# Patient Record
Sex: Female | Born: 1980 | Race: White | Hispanic: No | Marital: Married | Smoking: Never smoker
Health system: Southern US, Community
[De-identification: ages and names within clinical notes are randomized; demographics above are authoritative.]

## PROBLEM LIST (undated history)

## (undated) DIAGNOSIS — F419 Anxiety disorder, unspecified: Secondary | ICD-10-CM

## (undated) HISTORY — PX: GALLBLADDER SURGERY: SHX652

## (undated) HISTORY — PX: CHOLECYSTECTOMY: SHX55

## (undated) HISTORY — PX: TUBAL LIGATION: SHX77

---

## 2012-11-16 ENCOUNTER — Emergency Department (HOSPITAL_COMMUNITY)
Admission: EM | Admit: 2012-11-16 | Discharge: 2012-11-16 | Disposition: A | Discharge status: Home / Self Care | Payer: Self-pay | Attending: Emergency Medicine | Admitting: Emergency Medicine

## 2012-11-16 ENCOUNTER — Encounter (HOSPITAL_COMMUNITY): Payer: Self-pay | Admitting: Emergency Medicine

## 2012-11-16 DIAGNOSIS — K029 Dental caries, unspecified: Secondary | ICD-10-CM | POA: Insufficient documentation

## 2012-11-16 DIAGNOSIS — F411 Generalized anxiety disorder: Secondary | ICD-10-CM | POA: Insufficient documentation

## 2012-11-16 DIAGNOSIS — K0889 Other specified disorders of teeth and supporting structures: Secondary | ICD-10-CM

## 2012-11-16 HISTORY — DX: Anxiety disorder, unspecified: F41.9

## 2012-11-16 MED ORDER — HYDROCODONE-ACETAMINOPHEN 5-325 MG PO TABS: ORAL_TABLET | ORAL | Status: DC

## 2012-11-16 MED ORDER — AMOXICILLIN 250 MG PO CAPS
500.0000 mg | ORAL_CAPSULE | Freq: Once | ORAL | Status: AC
  Administered 2012-11-16: 500 mg via ORAL
  Filled 2012-11-16: qty 2

## 2012-11-16 MED ORDER — AMOXICILLIN 500 MG PO CAPS: 500.0000 mg | ORAL_CAPSULE | Freq: Three times a day (TID) | ORAL | Status: DC

## 2012-11-16 MED ORDER — HYDROCODONE-ACETAMINOPHEN 5-325 MG PO TABS
1.0000 | ORAL_TABLET | Freq: Once | ORAL | Status: AC
  Administered 2012-11-16: 1 via ORAL
  Filled 2012-11-16: qty 1

## 2012-11-16 NOTE — ED Provider Notes (Signed)
History     CSN: [id number]  Arrival date & time 11/16/12  0930   First MD Initiated Contact with Patient 11/16/12 1002      Chief Complaint  Patient presents with  . Dental Pain    (Consider location/radiation/quality/duration/timing/severity/associated sxs/prior treatment) Patient is a 32 y.o. female presenting with tooth pain. The history is provided by the patient.  Dental PainThe primary symptoms include mouth pain. Primary symptoms do not include dental injury, oral bleeding, headaches, fever, shortness of breath, sore throat, angioedema or cough. The symptoms began more than 1 week ago. The symptoms are waxing and waning. The symptoms are recurrent. The symptoms occur constantly.  Mouth pain occurs frequently. Mouth pain is worsening. Affected locations include: teeth and gum(s).  Additional symptoms include: dental sensitivity to temperature, gum tenderness and ear pain. Additional symptoms do not include: gum swelling, trismus, jaw pain, facial swelling, trouble swallowing, pain with swallowing and swollen glands. Medical issues include: periodontal disease. Medical issues do not include: smoking.    Past Medical History  Diagnosis Date  . Anxiety     Past Surgical History  Procedure Date  . Cholecystectomy   . Tubal ligation     Family History  Problem Relation Age of Onset  . Cancer Other   . Diabetes Other     History  Substance Use Topics  . Smoking status: Never Smoker   . Smokeless tobacco: Never Used  . Alcohol Use: No    OB History    Grav Para Term Preterm Abortions TAB SAB Ect Mult Living   1 1 1       1       Review of Systems  Constitutional: Negative for fever and appetite change.  HENT: Positive for ear pain and dental problem. Negative for congestion, sore throat, facial swelling, trouble swallowing, neck pain and neck stiffness.   Eyes: Negative for pain and visual disturbance.  Respiratory: Negative for cough and shortness of breath.     Neurological: Negative for dizziness, facial asymmetry and headaches.  Hematological: Negative for adenopathy.  All other systems reviewed and are negative.    Allergies  Review of patient's allergies indicates no known allergies.  Home Medications   Current Outpatient Rx  Name  Route  Sig  Dispense  Refill  . DIPHENHYDRAMINE-APAP (SLEEP) 25-500 MG PO TABS   Oral   Take 2 tablets by mouth at bedtime as needed. For pain         . IBUPROFEN 200 MG PO TABS   Oral   Take 800 mg by mouth every 6 (six) hours as needed.           BP 129/69  Pulse 79  Temp 97.9 F (36.6 C) (Oral)  Resp 18  Ht 5\' 7"  (1.702 m)  Wt 202 lb (91.627 kg)  BMI 31.64 kg/m2  SpO2 100%  LMP 11/12/2012  Physical Exam  Nursing note and vitals reviewed. Constitutional: She is oriented to person, place, and time. She appears well-developed and well-nourished. No distress.  HENT:  Head: Normocephalic and atraumatic. No trismus in the jaw.  Right Ear: Tympanic membrane and ear canal normal.  Left Ear: Tympanic membrane and ear canal normal.  Mouth/Throat: Uvula is midline, oropharynx is clear and moist and mucous membranes are normal. Dental caries present. No dental abscesses or uvula swelling.         Dental decay of the left lower first molar.  No dental abscess, trismus, or facial swelling  Neck: Normal  range of motion. Neck supple.  Cardiovascular: Normal rate, regular rhythm and normal heart sounds.   No murmur heard. Pulmonary/Chest: Effort normal and breath sounds normal.  Musculoskeletal: Normal range of motion.  Lymphadenopathy:    She has no cervical adenopathy.  Neurological: She is alert and oriented to person, place, and time. She exhibits normal muscle tone. Coordination normal.  Skin: Skin is warm and dry.    ED Course  Procedures (including critical care time)  Labs Reviewed - No data to display No results found.     MDM     ttp of the left lower first molar.  No  facial edema, trismus or obvious dental abscess.  Has appt with a dentist next Thursday.     Prescribed: Amoxil norco #20  Khristian Phillippi L. Harvey, [place] 11/17/12 2107

## 2012-11-16 NOTE — ED Notes (Signed)
Patient left lower dental pain that radiates up jaw and into ear. Patient reports taking motrin and tylenol with no relief.

## 2012-11-17 NOTE — ED Provider Notes (Signed)
Medical screening examination/treatment/procedure(s) were performed by non-physician practitioner and as supervising physician I was immediately available for consultation/collaboration.   Benny Lennert, MD 11/17/12 2111

## 2014-08-15 ENCOUNTER — Encounter (HOSPITAL_COMMUNITY): Payer: Self-pay | Admitting: Emergency Medicine

## 2015-04-21 ENCOUNTER — Emergency Department (HOSPITAL_COMMUNITY)
Admission: EM | Admit: 2015-04-21 | Discharge: 2015-04-21 | Disposition: A | Discharge status: Home / Self Care | Payer: Self-pay | Attending: Emergency Medicine | Admitting: Emergency Medicine

## 2015-04-21 ENCOUNTER — Emergency Department (HOSPITAL_COMMUNITY): Payer: Self-pay

## 2015-04-21 ENCOUNTER — Encounter (HOSPITAL_COMMUNITY): Payer: Self-pay | Admitting: Emergency Medicine

## 2015-04-21 DIAGNOSIS — Z8659 Personal history of other mental and behavioral disorders: Secondary | ICD-10-CM | POA: Insufficient documentation

## 2015-04-21 DIAGNOSIS — Y9389 Activity, other specified: Secondary | ICD-10-CM | POA: Insufficient documentation

## 2015-04-21 DIAGNOSIS — Y9289 Other specified places as the place of occurrence of the external cause: Secondary | ICD-10-CM | POA: Insufficient documentation

## 2015-04-21 DIAGNOSIS — Y998 Other external cause status: Secondary | ICD-10-CM | POA: Insufficient documentation

## 2015-04-21 DIAGNOSIS — S4992XA Unspecified injury of left shoulder and upper arm, initial encounter: Secondary | ICD-10-CM | POA: Insufficient documentation

## 2015-04-21 MED ORDER — IBUPROFEN 800 MG PO TABS: 800.0000 mg | ORAL_TABLET | Freq: Three times a day (TID) | ORAL | Status: AC

## 2015-04-21 MED ORDER — HYDROCODONE-ACETAMINOPHEN 5-325 MG PO TABS: ORAL_TABLET | ORAL | Status: DC

## 2015-04-21 NOTE — Discharge Instructions (Signed)
Shoulder Sprain °A shoulder sprain is the result of damage to the tough, fiber-like tissues (ligaments) that help hold your shoulder in place. The ligaments may be stretched or torn. Besides the main shoulder joint (the ball and socket), there are several smaller joints that connect the bones in this area. A sprain usually involves one of those joints. Most often it is the acromioclavicular (or AC) joint. That is the joint that connects the collarbone (clavicle) and the shoulder blade (scapula) at the top point of the shoulder blade (acromion). °A shoulder sprain is a mild form of what is called a shoulder separation. Recovering from a shoulder sprain may take some time. For some, pain lingers for several months. Most people recover without long term problems. °CAUSES  °· A shoulder sprain is usually caused by some kind of trauma. This might be: °¨ Falling on an outstretched arm. °¨ Being hit hard on the shoulder. °¨ Twisting the arm. °· Shoulder sprains are more likely to occur in people who: °¨ Play sports. °¨ Have balance or coordination problems. °SYMPTOMS  °· Pain when you move your shoulder. °· Limited ability to move the shoulder. °· Swelling and tenderness on top of the shoulder. °· Redness or warmth in the shoulder. °· Bruising. °· A change in the shape of the shoulder. °DIAGNOSIS  °Your healthcare provider may: °· Ask about your symptoms. °· Ask about recent activity that might have caused those symptoms. °· Examine your shoulder. You may be asked to do simple exercises to test movement. The other shoulder will be examined for comparison. °· Order some tests that provide a look inside the body. They can show the extent of the injury. The tests could include: °¨ X-rays. °¨ CT (computed tomography) scan. °¨ MRI (magnetic resonance imaging) scan. °RISKS AND COMPLICATIONS °· Loss of full shoulder motion. °· Ongoing shoulder pain. °TREATMENT  °How long it takes to recover from a shoulder sprain depends on how  severe it was. Treatment options may include: °· Rest. You should not use the arm or shoulder until it heals. °· Ice. For 2 or 3 days after the injury, put an ice pack on the shoulder up to 4 times a day. It should stay on for 15 to 20 minutes each time. Wrap the ice in a towel so it does not touch your skin. °· Over-the-counter medicine to relieve pain. °· A sling or brace. This will keep the arm still while the shoulder is healing. °· Physical therapy or rehabilitation exercises. These will help you regain strength and motion. Ask your healthcare provider when it is OK to begin these exercises. °· Surgery. The need for surgery is rare with a sprained shoulder, but some people may need surgery to keep the joint in place and reduce pain. °HOME CARE INSTRUCTIONS  °· Ask your healthcare provider about what you should and should not do while your shoulder heals. °· Make sure you know how to apply ice to the correct area of your shoulder. °· Talk with your healthcare provider about which medications should be used for pain and swelling. °· If rehabilitation therapy will be needed, ask your healthcare provider to refer you to a therapist. If it is not recommended, then ask about at-home exercises. Find out when exercise should begin. °SEEK MEDICAL CARE IF:  °Your pain, swelling, or redness at the joint increases. °SEEK IMMEDIATE MEDICAL CARE IF:  °· You have a fever. °· You cannot move your arm or shoulder. °Document Released: 02/16/2009 Document   Revised: 12/23/2011 Document Reviewed: 02/16/2009 °[employer]® Patient Information ©2015 [employer]. This information is not intended to replace advice given to you by your health care provider. Make sure you discuss any questions you have with your health care provider. ° °
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
## 2015-06-14 ENCOUNTER — Emergency Department (HOSPITAL_COMMUNITY): Payer: Medicaid Other

## 2015-06-14 ENCOUNTER — Emergency Department (HOSPITAL_COMMUNITY)
Admission: EM | Admit: 2015-06-14 | Discharge: 2015-06-14 | Disposition: A | Discharge status: Home / Self Care | Payer: Medicaid Other | Attending: Emergency Medicine | Admitting: Emergency Medicine

## 2015-06-14 ENCOUNTER — Encounter (HOSPITAL_COMMUNITY): Payer: Self-pay | Admitting: *Deleted

## 2015-06-14 DIAGNOSIS — Z8659 Personal history of other mental and behavioral disorders: Secondary | ICD-10-CM | POA: Insufficient documentation

## 2015-06-14 DIAGNOSIS — Z791 Long term (current) use of non-steroidal anti-inflammatories (NSAID): Secondary | ICD-10-CM | POA: Diagnosis not present

## 2015-06-14 DIAGNOSIS — Z792 Long term (current) use of antibiotics: Secondary | ICD-10-CM | POA: Diagnosis not present

## 2015-06-14 DIAGNOSIS — M25511 Pain in right shoulder: Secondary | ICD-10-CM | POA: Diagnosis present

## 2015-06-14 MED ORDER — KETOROLAC TROMETHAMINE 60 MG/2ML IM SOLN
60.0000 mg | Freq: Once | INTRAMUSCULAR | Status: AC
  Administered 2015-06-14: 60 mg via INTRAMUSCULAR
  Filled 2015-06-14: qty 2

## 2015-06-14 MED ORDER — PROMETHAZINE HCL 25 MG/ML IJ SOLN
25.0000 mg | Freq: Once | INTRAMUSCULAR | Status: AC
Start: 2015-06-14 — End: 2015-06-14
  Administered 2015-06-14: 25 mg via INTRAMUSCULAR
  Filled 2015-06-14: qty 1
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
## 2015-06-14 NOTE — Discharge Instructions (Signed)
Ibuprofen 600 mg 3 times daily for the next 5 days.  Wear your shoulder sling.  Follow-up with orthopedics if not improving in the next week. The contact information for Dr. Romeo Apple has been provided for you to call to arrange this appointment if necessary.   Shoulder Pain The shoulder is the joint that connects your arms to your body. The bones that form the shoulder joint include the upper arm bone (humerus), the shoulder blade (scapula), and the collarbone (clavicle). The top of the humerus is shaped like a ball and fits into a rather flat socket on the scapula (glenoid cavity). A combination of muscles and strong, fibrous tissues that connect muscles to bones (tendons) support your shoulder joint and hold the ball in the socket. Small, fluid-filled sacs (bursae) are located in different areas of the joint. They act as cushions between the bones and the overlying soft tissues and help reduce friction between the gliding tendons and the bone as you move your arm. Your shoulder joint allows a wide range of motion in your arm. This range of motion allows you to do things like scratch your back or throw a ball. However, this range of motion also makes your shoulder more prone to pain from overuse and injury. Causes of shoulder pain can originate from both injury and overuse and usually can be grouped in the following four categories:  Redness, swelling, and pain (inflammation) of the tendon (tendinitis) or the bursae (bursitis).  Instability, such as a dislocation of the joint.  Inflammation of the joint (arthritis).  Broken bone (fracture). HOME CARE INSTRUCTIONS   Apply ice to the sore area.  Put ice in a plastic bag.  Place a towel between your skin and the bag.  Leave the ice on for 15-20 minutes, 3-4 times per day for the first 2 days, or as directed by your health care provider.  Stop using cold packs if they do not help with the pain.  If you have a shoulder sling or immobilizer,  wear it as long as your caregiver instructs. Only remove it to shower or bathe. Move your arm as little as possible, but keep your hand moving to prevent swelling.  Squeeze a soft ball or foam pad as much as possible to help prevent swelling.  Only take over-the-counter or prescription medicines for pain, discomfort, or fever as directed by your caregiver. SEEK MEDICAL CARE IF:   Your shoulder pain increases, or new pain develops in your arm, hand, or fingers.  Your hand or fingers become cold and numb.  Your pain is not relieved with medicines. SEEK IMMEDIATE MEDICAL CARE IF:   Your arm, hand, or fingers are numb or tingling.  Your arm, hand, or fingers are significantly swollen or turn white or blue. MAKE SURE YOU:   Understand these instructions.  Will watch your condition.  Will get help right away if you are not doing well or get worse. Document Released: 07/10/2005 Document Revised: 02/14/2014 Document Reviewed: 09/14/2011 [facility] Patient Information 2015 [place], [place]. This information is not intended to replace advice given to you by your health care provider. Make sure you discuss any questions you have with your health care provider.

## 2015-06-26 ENCOUNTER — Ambulatory Visit: Payer: Medicaid Other | Admitting: Orthopedic Surgery

## 2015-07-03 ENCOUNTER — Ambulatory Visit: Payer: Medicaid Other | Admitting: Orthopedic Surgery

## 2015-07-11 ENCOUNTER — Ambulatory Visit: Payer: Medicaid Other | Admitting: Orthopedic Surgery

## 2019-05-24 ENCOUNTER — Other Ambulatory Visit: Payer: Self-pay

## 2019-05-24 DIAGNOSIS — Z20822 Contact with and (suspected) exposure to covid-19: Secondary | ICD-10-CM

## 2019-05-25 LAB — NOVEL CORONAVIRUS, NAA: SARS-CoV-2, NAA: NOT DETECTED

## 2020-06-30 ENCOUNTER — Other Ambulatory Visit: Payer: Self-pay

## 2020-06-30 ENCOUNTER — Ambulatory Visit
Admission: EM | Admit: 2020-06-30 | Discharge: 2020-06-30 | Disposition: A | Discharge status: Home / Self Care | Payer: Managed Care, Other (non HMO)

## 2020-08-23 ENCOUNTER — Emergency Department (HOSPITAL_COMMUNITY)
Admission: EM | Admit: 2020-08-23 | Discharge: 2020-08-24 | Disposition: A | Discharge status: Home / Self Care | Payer: Managed Care, Other (non HMO) | Attending: Emergency Medicine | Admitting: Emergency Medicine

## 2020-08-23 DIAGNOSIS — S298XXA Other specified injuries of thorax, initial encounter: Secondary | ICD-10-CM

## 2020-08-23 DIAGNOSIS — Y999 Unspecified external cause status: Secondary | ICD-10-CM | POA: Diagnosis not present

## 2020-08-23 DIAGNOSIS — Y92410 Unspecified street and highway as the place of occurrence of the external cause: Secondary | ICD-10-CM | POA: Diagnosis not present

## 2020-08-23 DIAGNOSIS — S0990XA Unspecified injury of head, initial encounter: Secondary | ICD-10-CM | POA: Diagnosis not present

## 2020-08-23 DIAGNOSIS — S2222XA Fracture of body of sternum, initial encounter for closed fracture: Secondary | ICD-10-CM

## 2020-08-23 DIAGNOSIS — R1010 Upper abdominal pain, unspecified: Secondary | ICD-10-CM | POA: Insufficient documentation

## 2020-08-23 DIAGNOSIS — F1210 Cannabis abuse, uncomplicated: Secondary | ICD-10-CM | POA: Diagnosis not present

## 2020-08-23 DIAGNOSIS — S93401A Sprain of unspecified ligament of right ankle, initial encounter: Secondary | ICD-10-CM | POA: Diagnosis not present

## 2020-08-23 DIAGNOSIS — S299XXA Unspecified injury of thorax, initial encounter: Secondary | ICD-10-CM | POA: Diagnosis present

## 2020-08-23 DIAGNOSIS — Y939 Activity, unspecified: Secondary | ICD-10-CM | POA: Diagnosis not present
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
## 2020-08-24 ENCOUNTER — Encounter (HOSPITAL_COMMUNITY): Payer: Self-pay

## 2020-08-24 ENCOUNTER — Other Ambulatory Visit: Payer: Self-pay

## 2020-08-24 ENCOUNTER — Emergency Department (HOSPITAL_COMMUNITY): Payer: Managed Care, Other (non HMO)

## 2020-08-24 LAB — COMPREHENSIVE METABOLIC PANEL
ALT: 21 U/L (ref 0–44)
AST: 23 U/L (ref 15–41)
Albumin: 3.3 g/dL — ABNORMAL LOW (ref 3.5–5.0)
Alkaline Phosphatase: 62 U/L (ref 38–126)
Anion gap: 10 (ref 5–15)
BUN: 11 mg/dL (ref 6–20)
CO2: 21 mmol/L — ABNORMAL LOW (ref 22–32)
Calcium: 8.8 mg/dL — ABNORMAL LOW (ref 8.9–10.3)
Chloride: 108 mmol/L (ref 98–111)
Creatinine, Ser: 1.19 mg/dL — ABNORMAL HIGH (ref 0.44–1.00)
GFR, Estimated: >60 mL/min (ref >60)
Glucose, Bld: 105 mg/dL — ABNORMAL HIGH (ref 70–99)
Potassium: 3.6 mmol/L (ref 3.5–5.1)
Sodium: 139 mmol/L (ref 135–145)
Total Bilirubin: 0.8 mg/dL (ref 0.3–1.2)
Total Protein: 6.3 g/dL — ABNORMAL LOW (ref 6.5–8.1)

## 2020-08-24 LAB — I-STAT BETA HCG BLOOD, ED (MC, WL, AP ONLY): I-stat hCG, quantitative: <5 m[IU]/mL (ref <5)

## 2020-08-24 LAB — LACTIC ACID, PLASMA
Lactic Acid, Venous: 1.1 mmol/L (ref 0.5–1.9)
Lactic Acid, Venous: 2.4 mmol/L (ref 0.5–1.9)

## 2020-08-24 LAB — CBC WITH DIFFERENTIAL/PLATELET
Abs Immature Granulocytes: 0.33 10*3/uL — ABNORMAL HIGH (ref 0.00–0.07)
Basophils Absolute: 0.1 10*3/uL (ref 0.0–0.1)
Basophils Relative: 0 %
Eosinophils Absolute: 0.1 10*3/uL (ref 0.0–0.5)
Eosinophils Relative: 1 %
HCT: 44.6 % (ref 36.0–46.0)
Hemoglobin: 14.5 g/dL (ref 12.0–15.0)
Immature Granulocytes: 1 %
Lymphocytes Relative: 13 %
Lymphs Abs: 2.9 10*3/uL (ref 0.7–4.0)
MCH: 29.9 pg (ref 26.0–34.0)
MCHC: 32.5 g/dL (ref 30.0–36.0)
MCV: 92 fL (ref 80.0–100.0)
Monocytes Absolute: 1 10*3/uL (ref 0.1–1.0)
Monocytes Relative: 4 %
Neutro Abs: 18.7 10*3/uL — ABNORMAL HIGH (ref 1.7–7.7)
Neutrophils Relative %: 81 %
Platelets: 335 10*3/uL (ref 150–400)
RBC: 4.85 MIL/uL (ref 3.87–5.11)
RDW: 12.8 % (ref 11.5–15.5)
WBC: 23 10*3/uL — ABNORMAL HIGH (ref 4.0–10.5)
nRBC: 0 % (ref 0.0–0.2)

## 2020-08-24 LAB — ETHANOL: Alcohol, Ethyl (B): <10 mg/dL (ref <10)

## 2020-08-24 MED ORDER — IOHEXOL 300 MG/ML  SOLN
100.0000 mL | Freq: Once | INTRAMUSCULAR | Status: AC | PRN
  Administered 2020-08-24: 100 mL via INTRAVENOUS

## 2020-08-24 MED ORDER — OXYCODONE-ACETAMINOPHEN 5-325 MG PO TABS
1.0000 | ORAL_TABLET | Freq: Once | ORAL | Status: AC
  Administered 2020-08-24: 1 via ORAL
  Filled 2020-08-24: qty 1

## 2020-08-24 MED ORDER — OXYCODONE-ACETAMINOPHEN 5-325 MG PO TABS: 1.0000 | ORAL_TABLET | ORAL | 0 refills | Status: DC | PRN

## 2020-08-24 MED ORDER — ONDANSETRON HCL 4 MG/2ML IJ SOLN
4.0000 mg | Freq: Once | INTRAMUSCULAR | Status: AC
  Administered 2020-08-24: 4 mg via INTRAVENOUS
  Filled 2020-08-24: qty 2

## 2020-08-24 MED ORDER — MORPHINE SULFATE (PF) 4 MG/ML IV SOLN
4.0000 mg | Freq: Once | INTRAVENOUS | Status: AC
  Administered 2020-08-24: 4 mg via INTRAVENOUS
  Filled 2020-08-24: qty 1

## 2020-08-24 NOTE — Discharge Instructions (Signed)
Apply ice to painful areas.  Apply for 30 minutes at a time, 4 times a day.  Take ibuprofen or naproxen as needed for pain.  Take oxycodone-acetaminophen as needed for severe pain.  Please note that taking ibuprofen or naproxen along with oxycodone-acetaminophen gives additional pain relief.

## 2020-08-24 NOTE — ED Notes (Signed)
Patient verbalizes understanding of discharge instructions. Opportunity for questioning and answers were provided. Pt discharged from ED stable & ambulatory.   

## 2020-08-24 NOTE — ED Notes (Signed)
Pt to CT via stretcher

## 2020-08-24 NOTE — Progress Notes (Signed)
Orthopedic Tech Progress Note Patient Details:  Audrey Oconnor [birth year] [id number]  Ortho Devices Type of Ortho Device: ASO, Crutches Ortho Device/Splint Location: Right Ankle Ortho Device/Splint Interventions: Application, Adjustment   Post Interventions Patient Tolerated: Well Instructions Provided: Adjustment of device, Poper ambulation with device   Audrey Oconnor E Rangel Echeverri 08/24/2020, 3:06 AM

## 2021-07-16 ENCOUNTER — Other Ambulatory Visit (HOSPITAL_COMMUNITY): Payer: Self-pay | Admitting: Family Medicine

## 2021-07-16 DIAGNOSIS — Z1231 Encounter for screening mammogram for malignant neoplasm of breast: Secondary | ICD-10-CM

## 2021-07-26 ENCOUNTER — Ambulatory Visit (HOSPITAL_COMMUNITY): Payer: Managed Care, Other (non HMO)

## 2022-01-03 IMAGING — DX DG ANKLE COMPLETE 3+V*R*
3 series · 3 of 3 positions shown · non-contrast
Comparison: None.

CLINICAL DATA: Pain

EXAM:
RIGHT ANKLE - COMPLETE 3+ VIEW

[ankle lat (1 of 2)]
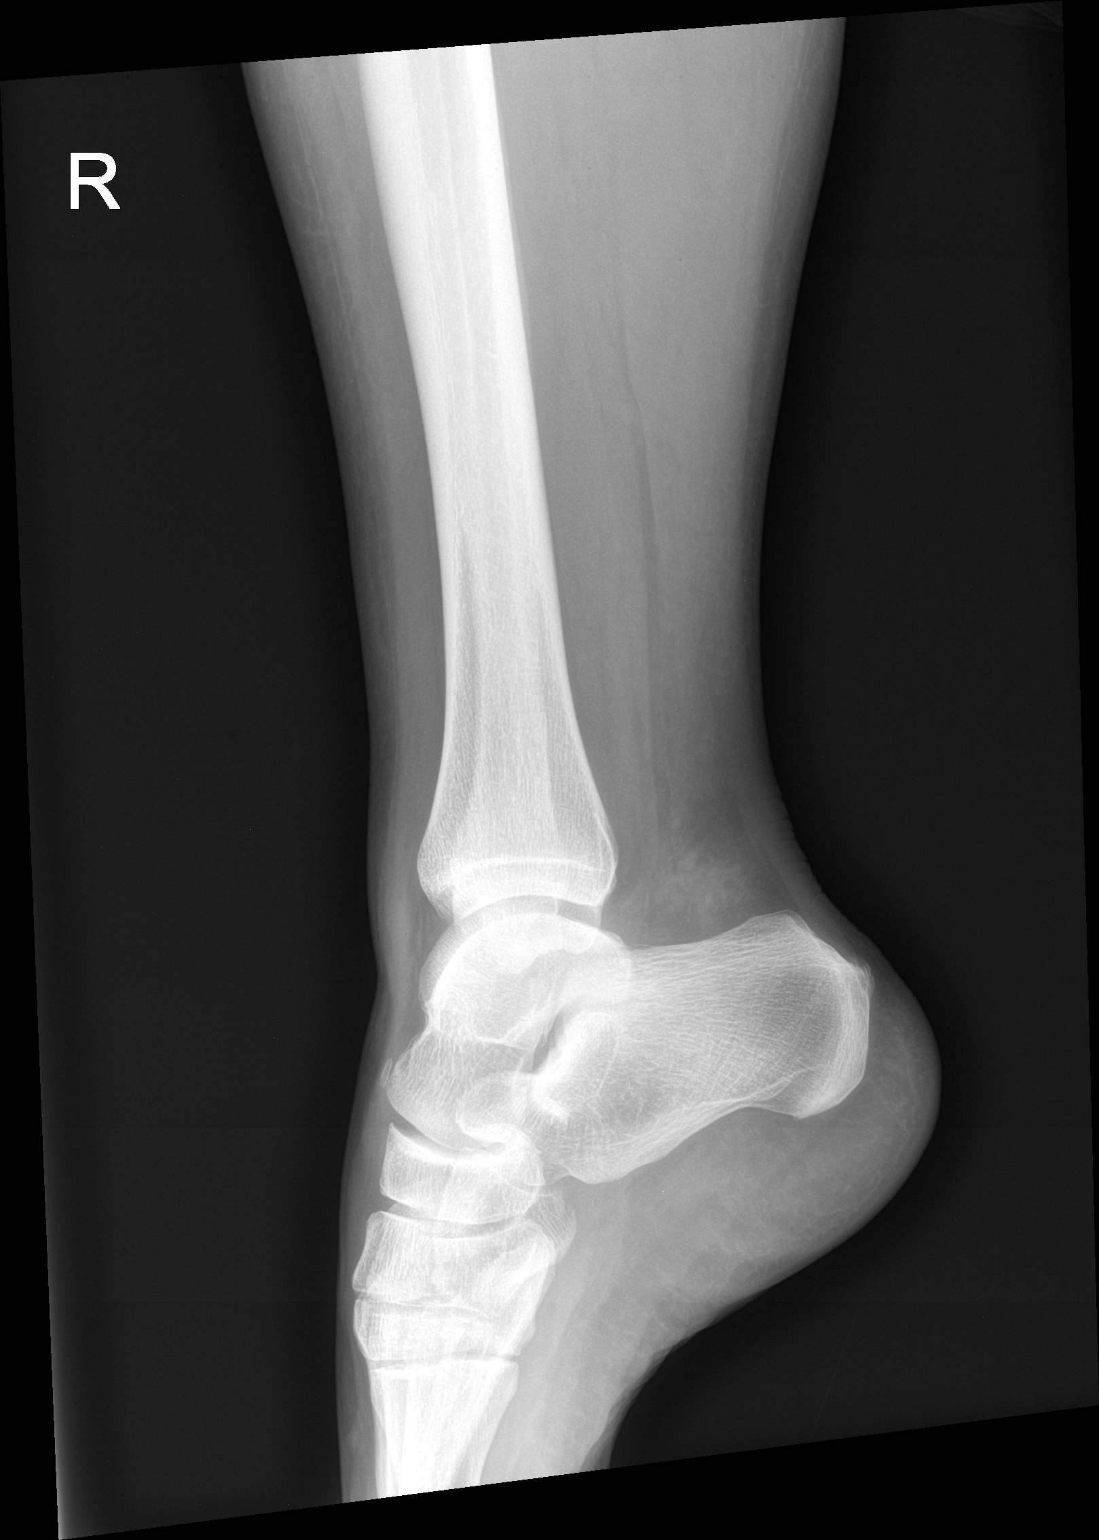

[ankle lat (2 of 2)]
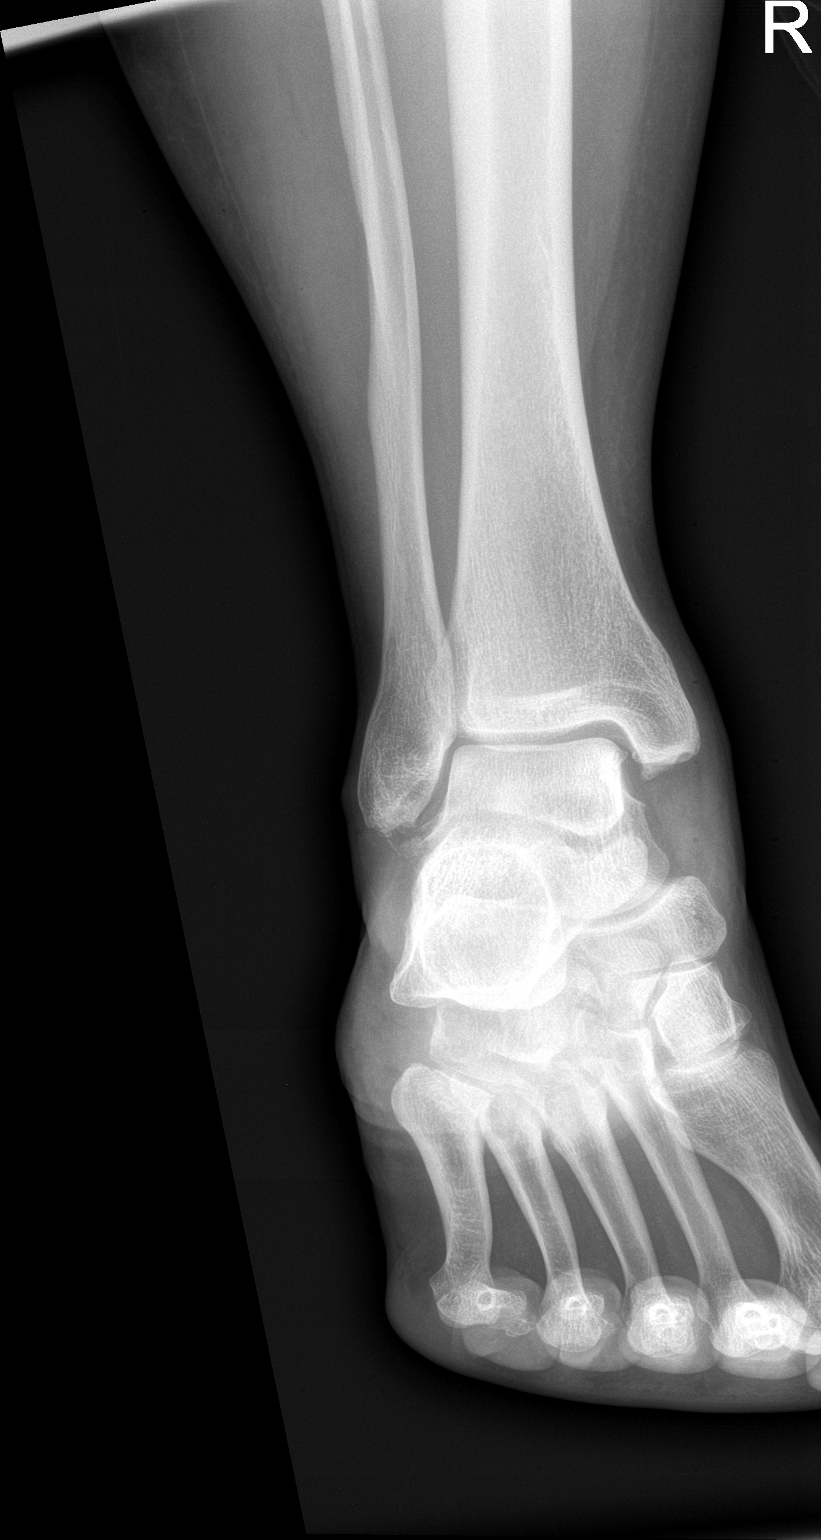

[ankle obl]
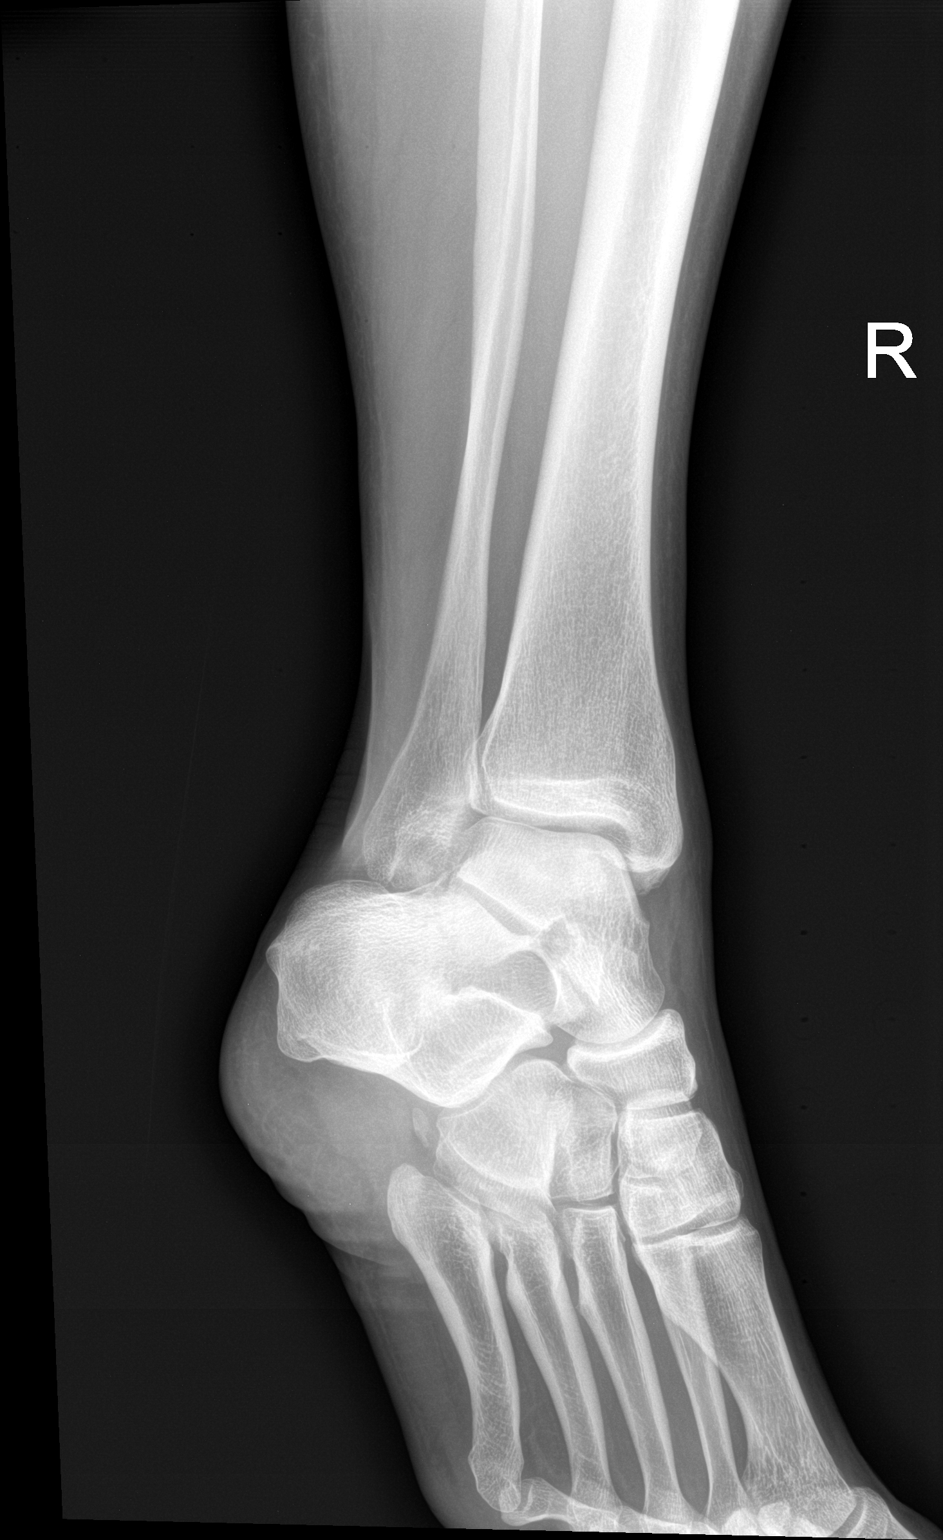

[3 of 3 positions shown; findings below may reference images not displayed]

FINDINGS: There is soft tissue swelling about the ankle. There is no definite
acute displaced fracture or dislocation, however evaluation was
somewhat limited by patient positioning. There are old posttraumatic
changes at the lateral malleolus and dorsal navicular.
IMPRESSION: Soft tissue swelling without evidence for an acute displaced
fracture or dislocation, however evaluation was somewhat limited by
patient positioning.
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
## 2023-08-27 ENCOUNTER — Ambulatory Visit (INDEPENDENT_AMBULATORY_CARE_PROVIDER_SITE_OTHER): Payer: Medicaid Other | Admitting: Podiatry

## 2023-08-27 ENCOUNTER — Encounter: Payer: Self-pay | Admitting: Podiatry

## 2023-08-27 VITALS — Ht 67.0 in | Wt 222.0 lb

## 2023-08-27 DIAGNOSIS — D492 Neoplasm of unspecified behavior of bone, soft tissue, and skin: Secondary | ICD-10-CM

## 2023-08-27 NOTE — Progress Notes (Signed)
Subjective:   Patient ID: Audrey Oconnor, female   DOB: 42 y.o.   MRN: [id number]   HPI Patient presents chronic lesion underneath her left foot which has been very painful stating it has been there for a few months and she does work weightbearing job.  Patient does not smoke currently likes to be active   Review of Systems  All other systems reviewed and are negative.       Objective:  Physical Exam Vitals and nursing note reviewed.  Constitutional:      Appearance: She is well-developed.  Pulmonary:     Effort: Pulmonary effort is normal.  Musculoskeletal:        General: Normal range of motion.  Skin:    General: Skin is warm.  Neurological:     Mental Status: She is alert.     Neurovascular status intact muscle strength adequate range of motion adequate with the patient noted to have exquisite pain lesion left that upon debridement shows pinpoint bleeding pain to lateral pressure.  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  May require more aggressive treatment if symptoms do not get better

## 2024-08-16 NOTE — Progress Notes (Deleted)
   New Patient Pulmonology Office Visit   Subjective:  Patient ID: Audrey Oconnor, female    DOB: 04-03-1981  MRN: [id number]  Referred by: Medicine, [facility]  CC: No chief complaint on file.   HPI Carlia Bomkamp is a 43 y.o. female with ***  {PULM QUESTIONNAIRES (Optional):33196}  ROS  Allergies: Patient has no known allergies.  Current Outpatient Medications:    diphenhydramine-acetaminophen  (TYLENOL  PM) 25-500 MG TABS, Take 2 tablets by mouth at bedtime as needed. For pain, Disp: , Rfl:    HYDROcodone -acetaminophen  (NORCO/VICODIN) 5-325 MG per tablet, Take one-two tabs po q 4-6 hrs prn pain, Disp: 20 tablet, Rfl: 0   ibuprofen  (ADVIL ,MOTRIN ) 800 MG tablet, Take 1 tablet (800 mg total) by mouth 3 (three) times daily., Disp: 21 tablet, Rfl: 0   oxyCODONE -acetaminophen  (PERCOCET) 5-325 MG tablet, Take 1 tablet by mouth every 4 (four) hours as needed for moderate pain., Disp: 20 tablet, Rfl: 0 Past Medical History:  Diagnosis Date   Anxiety    Past Surgical History:  Procedure Laterality Date   CHOLECYSTECTOMY     GALLBLADDER SURGERY     TUBAL LIGATION     Family History  Problem Relation Age of Onset   Cancer Other    Diabetes Other    Social History   Socioeconomic History   Marital status: Married    Spouse name: Not on file   Number of children: Not on file   Years of education: Not on file   Highest education level: Not on file  Occupational History   Not on file  Tobacco Use   Smoking status: Never   Smokeless tobacco: Never  Substance and Sexual Activity   Alcohol use: No   Drug use: No   Sexual activity: Yes    Birth control/protection: Surgical  Other Topics Concern   Not on file  Social History Narrative   Not on file   Social Drivers of Health   Financial Resource Strain: Not on file  Food Insecurity: Not on file  Transportation Needs: Not on file  Physical Activity: Not on file  Stress: Not on file  Social Connections: Not on file   Intimate Partner Violence: Not on file       Objective:  There were no vitals taken for this visit. {Pulm Vitals (Optional):32837}  Physical Exam  Diagnostic Review:  {Labs (Optional):32838}     Assessment & Plan:   Assessment & Plan   No orders of the defined types were placed in this encounter.     No follow-ups on file.   Jalan Bodi, MD

## 2024-08-17 ENCOUNTER — Encounter (HOSPITAL_BASED_OUTPATIENT_CLINIC_OR_DEPARTMENT_OTHER): Payer: Self-pay

## 2024-08-17 ENCOUNTER — Ambulatory Visit (HOSPITAL_BASED_OUTPATIENT_CLINIC_OR_DEPARTMENT_OTHER): Admitting: Pulmonary Disease

## 2024-08-17 ENCOUNTER — Ambulatory Visit (INDEPENDENT_AMBULATORY_CARE_PROVIDER_SITE_OTHER)

## 2024-08-17 VITALS — BP 120/83 | HR 70 | Ht 67.0 in | Wt 177.0 lb

## 2024-08-17 DIAGNOSIS — F32A Depression, unspecified: Secondary | ICD-10-CM | POA: Diagnosis not present

## 2024-08-17 DIAGNOSIS — G4710 Hypersomnia, unspecified: Secondary | ICD-10-CM

## 2024-08-17 DIAGNOSIS — F419 Anxiety disorder, unspecified: Secondary | ICD-10-CM

## 2024-08-17 DIAGNOSIS — G4719 Other hypersomnia: Secondary | ICD-10-CM

## 2024-08-17 NOTE — Progress Notes (Signed)
 Epworth Sleepiness Scale  Use the following scale to choose the most appropriate number for each situation. 0 Would never nod off 1  Slight  chance of nodding off 2 Moderate chance of nodding off 3 High chance of nodding off  Sitting and reading: 2  Watching TV: 2  Sitting, inactive, in a public place (e.g., in a meeting, theater, or dinner event): 0  As a passenger in a car for an hour or more without stopping for a break: 1  Lying down to rest when circumstances permit:2  Sitting and talking to someone: 0 Sitting quietly after a meal without alcohol: 1 In a car, while stopped for a few  minutes in traffic or at a light: 0   TOTOAL: 8

## 2024-08-17 NOTE — Patient Instructions (Signed)
 Complete home sleep test as ordered.  Follow sleep hygiene as discussed; see attached information.  Follow up in 8 weeks for sleep study review.

## 2024-08-17 NOTE — Progress Notes (Signed)
 @Patient  ID: Audrey Oconnor, female    DOB: 1980/11/11, 43 y.o.   MRN: [id number]  Chief Complaint  Patient presents with   Establish Care    New Sleep     Referring provider: Medicine, [facility]  HPI: Discussed the use of AI scribe software for clinical note transcription with the patient, who gave verbal consent to proceed.  History of Present Illness Audrey Oconnor is a 43 year old female who presents with concerns about possible sleep apnea.  She has a long-standing history of insomnia, characterized by difficulty maintaining sleep, typically sleeping between one to three hours per night and waking up five to ten times without a clear reason. Occasionally, she wakes up to urinate or engages in activities like cleaning when unable to return to sleep. Sometimes, she manages to fall back asleep quickly.  She experiences episodes of waking up with a dry throat, leading to choking and watery eyes, occurring about three times a week. These episodes are often followed by a need to catch her breath. She reports excessive daytime sleepiness, particularly after nights with minimal sleep, which affects her ability to drive safely. She has never fallen asleep at the wheel but has felt drowsy while driving.  She describes feeling very tired upon waking, although occasionally she wakes up with energy. She experiences daytime fatigue, often yawning and feeling 'dragging ass' throughout the day. Her sleep schedule is consistent, going to bed between 10 PM and 1 AM and waking up between 7 and 8:30 AM due to her dog's routine.  She has experienced weight loss over the past two years, initially intentional, reducing from 253 pounds. She recalls waking herself up snoring at her heaviest weight but has not noticed snoring recently. She recorded herself sleeping and did not hear snoring, though she talks in her sleep.  She has tried trazodone for sleep but discontinued it due to feeling like  she was in a 'walking dream' and it taking 24 hours to clear from her system. She was initially prescribed 50 mg, which she reduced to half and then a quarter before stopping.  She has a history of anxiety, which can cause her to feel flushed and warm, but she manages it well. No history of high blood pressure, heart attacks, strokes, or blood clots. She has had her gallbladder removed and a tubal ligation.  She has a family history of sleep apnea on her mother's side, with her mother affected.  She is physically active, walking at least two miles daily, which has contributed to her weight loss. She has never smoked cigarettes but has used marijuana, recently resuming use to manage anxiety and depression symptoms.     TEST/EVENTS : n/a  No Known Allergies  Immunization History  Administered Date(s) Administered   Tdap 08/02/2013    Past Medical History:  Diagnosis Date   Anxiety     Tobacco History: Social History   Tobacco Use  Smoking Status Never  Smokeless Tobacco Never   Counseling given: Not Answered   Outpatient Medications Prior to Visit  Medication Sig Dispense Refill   buPROPion (WELLBUTRIN SR) 150 MG 12 hr tablet 1 tablet in the morning Orally twice daily for 30 days (dose increased)     ibuprofen  (ADVIL ,MOTRIN ) 800 MG tablet Take 1 tablet (800 mg total) by mouth 3 (three) times daily. 21 tablet 0   diphenhydramine-acetaminophen  (TYLENOL  PM) 25-500 MG TABS Take 2 tablets by mouth at bedtime as needed. For pain  HYDROcodone -acetaminophen  (NORCO/VICODIN) 5-325 MG per tablet Take one-two tabs po q 4-6 hrs prn pain 20 tablet 0   oxyCODONE -acetaminophen  (PERCOCET) 5-325 MG tablet Take 1 tablet by mouth every 4 (four) hours as needed for moderate pain. 20 tablet 0   No facility-administered medications prior to visit.     Review of Systems: as per HPI  Constitutional:   No  weight loss, night sweats,  Fevers, chills, fatigue, or  lassitude.  HEENT:   No  headaches,  Difficulty swallowing,  Tooth/dental problems, or  Sore throat,                No sneezing, itching, ear ache, nasal congestion, post nasal drip,   CV:  No chest pain,  Orthopnea, PND, swelling in lower extremities, anasarca, dizziness, palpitations, syncope.   GI  No heartburn, indigestion, abdominal pain, nausea, vomiting, diarrhea, change in bowel habits, loss of appetite, bloody stools.   Resp: No shortness of breath with exertion or at rest.  No excess mucus, no productive cough,  No non-productive cough,  No coughing up of blood.  No change in color of mucus.  No wheezing.  No chest wall deformity  Skin: no rash or lesions.  GU: no dysuria, change in color of urine, no urgency or frequency.  No flank pain, no hematuria   MS:  No joint pain or swelling.  No decreased range of motion.  No back pain.    Physical Exam  BP 120/83   Pulse 70   Ht 5' 7 (1.702 m)   Wt 177 lb (80.3 kg)   SpO2 99%   BMI 27.72 kg/m   GEN: A/Ox3; pleasant , NAD, well nourished    HEENT:  [place]/AT,  EACs-clear, TMs-wnl, NOSE-clear, THROAT-clear, no lesions, no postnasal drip or exudate noted. Mallampati 2  NECK:  Supple w/ fair ROM; no JVD; normal carotid impulses w/o bruits; no thyromegaly or nodules palpated; no lymphadenopathy.    RESP  Clear  P & A; w/o, wheezes/ rales/ or rhonchi. no accessory muscle use, no dullness to percussion  CARD:  RRR, no m/r/g, no peripheral edema, pulses intact, no cyanosis or clubbing.  GI:   Soft & nt; nml bowel sounds; no organomegaly or masses detected.   Musco: Warm bil, no deformities or joint swelling noted.   Neuro: alert, no focal deficits noted.    Skin: Warm, no lesions or rashes    Lab Results:  CBC    Component Value Date/Time   WBC 23.0 (H) 08/24/2020 0005   RBC 4.85 08/24/2020 0005   HGB 14.5 08/24/2020 0005   HCT 44.6 08/24/2020 0005   PLT 335 08/24/2020 0005   MCV 92.0 08/24/2020 0005   MCH 29.9 08/24/2020 0005   MCHC 32.5  08/24/2020 0005   RDW 12.8 08/24/2020 0005   LYMPHSABS 2.9 08/24/2020 0005   MONOABS 1.0 08/24/2020 0005   EOSABS 0.1 08/24/2020 0005   BASOSABS 0.1 08/24/2020 0005    BMET    Component Value Date/Time   NA 139 08/24/2020 0005   K 3.6 08/24/2020 0005   CL 108 08/24/2020 0005   CO2 21 (L) 08/24/2020 0005   GLUCOSE 105 (H) 08/24/2020 0005   BUN 11 08/24/2020 0005   CREATININE 1.19 (H) 08/24/2020 0005   CALCIUM 8.8 (L) 08/24/2020 0005   GFRNONAA >60 08/24/2020 0005    BNP No results found for: BNP  ProBNP No results found for: PROBNP  Imaging: No results found.  Administration History  None           No data to display          No results found for: NITRICOXIDE   Assessment & Plan:   Assessment & Plan Excessive daytime sleepiness  Assessment and Plan Assessment & Plan Sleep disturbance with suspected sleep apnea and insomnia Chronic sleep disturbance with frequent awakenings, daytime fatigue, and excessive sleepiness. Differential diagnosis includes sleep apnea and insomnia. - Ordered home sleep test to evaluate for sleep apnea. - Provided information on sleep hygiene, including maintaining a consistent sleep schedule, avoiding TV in bed, and using white noise if beneficial.  Daytime sleepiness Excessive daytime sleepiness, particularly after nights with minimal sleep. - Address underlying sleep disturbance with home sleep test and sleep hygiene recommendations.  Anxiety and depression Mild anxiety and depression, managed with Wellbutrin. Recent resumption of marijuana use due to perceived anxiety and depression symptoms. - Discussed potential impact of marijuana use on anxiety and depression.    Return in about 2 months (around 10/17/2024) for sleep study review.  Candis Dandy, PA-C 08/17/2024

## 2024-09-16 ENCOUNTER — Ambulatory Visit (HOSPITAL_BASED_OUTPATIENT_CLINIC_OR_DEPARTMENT_OTHER)

## 2024-09-16 DIAGNOSIS — G4719 Other hypersomnia: Secondary | ICD-10-CM

## 2024-09-17 DIAGNOSIS — Z0389 Encounter for observation for other suspected diseases and conditions ruled out: Secondary | ICD-10-CM | POA: Diagnosis not present

## 2024-10-01 ENCOUNTER — Other Ambulatory Visit: Payer: Self-pay | Admitting: Medical Genetics

## 2024-10-04 ENCOUNTER — Ambulatory Visit (HOSPITAL_BASED_OUTPATIENT_CLINIC_OR_DEPARTMENT_OTHER): Payer: Self-pay

## 2024-10-18 ENCOUNTER — Ambulatory Visit (HOSPITAL_BASED_OUTPATIENT_CLINIC_OR_DEPARTMENT_OTHER)

## 2024-10-26 ENCOUNTER — Encounter (HOSPITAL_BASED_OUTPATIENT_CLINIC_OR_DEPARTMENT_OTHER): Payer: Self-pay

## 2024-10-26 ENCOUNTER — Ambulatory Visit (INDEPENDENT_AMBULATORY_CARE_PROVIDER_SITE_OTHER)

## 2024-10-26 VITALS — BP 119/79 | HR 75 | Ht 67.0 in | Wt 175.0 lb

## 2024-10-26 DIAGNOSIS — F32A Depression, unspecified: Secondary | ICD-10-CM

## 2024-10-26 DIAGNOSIS — G4700 Insomnia, unspecified: Secondary | ICD-10-CM

## 2024-10-26 DIAGNOSIS — F419 Anxiety disorder, unspecified: Secondary | ICD-10-CM

## 2024-10-26 NOTE — Progress Notes (Signed)
 "  @Patient  ID: Audrey Oconnor, female    DOB: 1981/05/13, 44 y.o.   MRN: [id number]  Chief Complaint  Patient presents with   Follow-up    Sleep     Referring provider: Medicine, [facility]  HPI: Discussed the use of AI scribe software for clinical note transcription with the patient, who gave verbal consent to proceed.  History of Present Illness Audrey Oconnor is a 44 year old female who presents with insomnia to review her recent sleep study.  She experiences insomnia characterized by difficulty maintaining sleep and frequent awakenings, occurring approximately five out of seven nights. 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 She has tried melatonin, which resulted in vivid dreams and non-restful sleep. She avoids sleep medications due to prolonged side effects.  She has irregular menstrual cycles with heavy periods lasting seven to ten days. Birth control was prescribed but did not regulate her cycle and caused headaches. Despite the heavy flow, her cramps are less severe. She experiences prolonged depressive episodes, which she associates with her menstrual cycle.  Recent bloodwork indicated normal thyroid levels and CBC.  She is currently taking Wellbutrin, which was doubled in dose in July to assist with weight loss and manage her symptoms. She has experienced significant weight loss recently and attributes her symptoms to a combination of factors including her age, hormonal changes, and recent weight loss.  She recently had normal lab results, including cholesterol and electrolytes. She is proactive in managing her health by controlling other aspects of her life, such as diet and exercise, to compensate for her sleep issues.  She prefers to avoid further testing and medications to manage her medical issues if at all possible.  Last OV 08/17/2024: Audrey Oconnor is a 44 year old  female who presents with concerns about possible sleep apnea.   She has a long-standing history of insomnia, characterized by difficulty maintaining sleep, typically sleeping between one to three hours per night and waking up five to ten times without a clear reason. Occasionally, she wakes up to urinate or engages in activities like cleaning when unable to return to sleep. Sometimes, she manages to fall back asleep quickly.   She experiences episodes of waking up with a dry throat, leading to choking and watery eyes, occurring about three times a week. These episodes are often followed by a need to catch her breath. She reports excessive daytime sleepiness, particularly after nights with minimal sleep, which affects her ability to drive safely. She has never fallen asleep at the wheel but has felt drowsy while driving.   She describes feeling very tired upon waking, although occasionally she wakes up with energy. She experiences daytime fatigue, often yawning and feeling 'dragging ass' throughout the day. Her sleep schedule is consistent, going to bed between 10 PM and 1 AM and waking up between 7 and 8:30 AM due to her dog's routine.   She has experienced weight loss over the past two years, initially intentional, reducing from 253 pounds. She recalls waking herself up snoring at her heaviest weight but has not noticed snoring recently. She recorded herself sleeping and did not hear snoring, though she talks in her sleep.   She has tried trazodone for sleep but discontinued it due to feeling like she was in a 'walking dream' and it taking 24 hours to clear from her system. She was initially prescribed 50 mg, which she reduced to half and  then a quarter before stopping.   She has a history of anxiety, which can cause her to feel flushed and warm, but she manages it well. No history of high blood pressure, heart attacks, strokes, or blood clots. She has had her gallbladder removed and a tubal ligation.    She has a family history of sleep apnea on her mother's side, with her mother affected.   She is physically active, walking at least two miles daily, which has contributed to her weight loss. She has never smoked cigarettes but has used marijuana, recently resuming use to manage anxiety and depression symptoms.    TEST/EVENTS : HST 09/16/2024:  AHI 0.2/hr- no sleep disordered breathing  Allergies[1]  Immunization History  Administered Date(s) Administered   Tdap 08/02/2013    Past Medical History:  Diagnosis Date   Anxiety     Tobacco History: Tobacco Use History[2] Counseling given: Not Answered   Outpatient Medications Prior to Visit  Medication Sig Dispense Refill   buPROPion (WELLBUTRIN SR) 150 MG 12 hr tablet 1 tablet in the morning Orally twice daily for 30 days (dose increased)     ibuprofen  (ADVIL ,MOTRIN ) 800 MG tablet Take 1 tablet (800 mg total) by mouth 3 (three) times daily. 21 tablet 0   No facility-administered medications prior to visit.     Review of Systems: as per hpi  Constitutional:   No  weight loss, night sweats,  Fevers, chills, fatigue, or  lassitude.  HEENT:   No headaches,  Difficulty swallowing,  Tooth/dental problems, or  Sore throat,                No sneezing, itching, ear ache, nasal congestion, post nasal drip,   CV:  No chest pain,  Orthopnea, PND, swelling in lower extremities, anasarca, dizziness, palpitations, syncope.   GI  No heartburn, indigestion, abdominal pain, nausea, vomiting, diarrhea, change in bowel habits, loss of appetite, bloody stools.   Resp: No shortness of breath with exertion or at rest.  No excess mucus, no productive cough,  No non-productive cough,  No coughing up of blood.  No change in color of mucus.  No wheezing.  No chest wall deformity  Skin: no rash or lesions.  GU: no dysuria, change in color of urine, no urgency or frequency.  No flank pain, no hematuria   MS:  No joint pain or swelling.  No  decreased range of motion.  No back pain.    Physical Exam  BP 119/79   Pulse 75   Ht 5' 7 (1.702 m)   Wt 175 lb (79.4 kg)   SpO2 100%   BMI 27.41 kg/m   GEN: A/Ox3; pleasant , NAD, well nourished.  Somewhat tearful throughout interview/exam   HEENT:  [place]/AT,  EACs-clear, TMs-wnl, NOSE-clear, THROAT-clear, no lesions, no postnasal drip or exudate noted.   NECK:  Supple w/ fair ROM; no JVD; normal carotid impulses w/o bruits; no thyromegaly or nodules palpated; no lymphadenopathy.    RESP  Clear  P & A; w/o, wheezes/ rales/ or rhonchi. no accessory muscle use, no dullness to percussion  CARD:  RRR, no m/r/g, no peripheral edema, pulses intact, no cyanosis or clubbing.  GI:   Soft & nt; nml bowel sounds; no organomegaly or masses detected.   Musco: Warm bil, no deformities or joint swelling noted.   Neuro: alert, no focal deficits noted.    Skin: Warm, no lesions or rashes    Lab Results:  CBC  Component Value Date/Time   WBC 23.0 (H) 08/24/2020 0005   RBC 4.85 08/24/2020 0005   HGB 14.5 08/24/2020 0005   HCT 44.6 08/24/2020 0005   PLT 335 08/24/2020 0005   MCV 92.0 08/24/2020 0005   MCH 29.9 08/24/2020 0005   MCHC 32.5 08/24/2020 0005   RDW 12.8 08/24/2020 0005   LYMPHSABS 2.9 08/24/2020 0005   MONOABS 1.0 08/24/2020 0005   EOSABS 0.1 08/24/2020 0005   BASOSABS 0.1 08/24/2020 0005    BMET    Component Value Date/Time   NA 139 08/24/2020 0005   K 3.6 08/24/2020 0005   CL 108 08/24/2020 0005   CO2 21 (L) 08/24/2020 0005   GLUCOSE 105 (H) 08/24/2020 0005   BUN 11 08/24/2020 0005   CREATININE 1.19 (H) 08/24/2020 0005   CALCIUM 8.8 (L) 08/24/2020 0005   GFRNONAA >60 08/24/2020 0005    BNP No results found for: BNP  ProBNP No results found for: PROBNP  Imaging: No results found.  Administration History     None           No data to display          No results found for: NITRICOXIDE   Assessment & Plan:   Assessment &  Plan Insomnia, unspecified type  Assessment and Plan Assessment & Plan Insomnia Chronic insomnia with difficulty maintaining sleep, likely related to perimenopause, anxiety, and depression. Prefers non-pharmacological interventions. - Try chamomile tea at night. - Consider sleep diary to track patterns and hormonal cycles. - Discuss with OB GYN regarding hormonal management. - Consider in-lab sleep study if symptoms persist or worsen; offered this now, patient wishes to defer for now -  Offered medication sleep aids; patient declined for now - Follow up in 3-4 months or sooner if symptoms change.  Anxiety and depression Chronic anxiety and depression managed with Wellbutrin SR. Symptoms may be exacerbated by hormonal changes and insomnia. Hesitant to use additional medications. - Continue Wellbutrin SR 150 mg twice daily. - Discuss with OB GYN regarding hormonal management and mood impact. - Consider non-pharmacological interventions.   Return in about 4 months (around 02/23/2025).  Candis Dandy, PA-C 10/26/2024      [1] No Known Allergies [2]  Social History Tobacco Use  Smoking Status Never  Smokeless Tobacco Never   "

## 2024-10-27 ENCOUNTER — Other Ambulatory Visit: Payer: Self-pay | Admitting: Medical Genetics

## 2024-10-27 DIAGNOSIS — Z006 Encounter for examination for normal comparison and control in clinical research program: Secondary | ICD-10-CM

## 2024-11-04 ENCOUNTER — Encounter: Payer: Self-pay | Admitting: Podiatry

## 2024-11-04 ENCOUNTER — Ambulatory Visit: Admitting: Podiatry

## 2024-11-04 DIAGNOSIS — D492 Neoplasm of unspecified behavior of bone, soft tissue, and skin: Secondary | ICD-10-CM | POA: Diagnosis not present

## 2024-11-04 MED ORDER — FLUOROURACIL 5 % EX CREA: TOPICAL_CREAM | Freq: Two times a day (BID) | CUTANEOUS | 2 refills | Status: AC

## 2024-11-04 NOTE — Progress Notes (Signed)
 Subjective:   Patient ID: Audrey Oconnor, female   DOB: 44 y.o.   MRN: [id number]   HPI Patient presents with a lot of pain in the left plantar foot states it did well for a while but over the last few months its come back again   ROS      Objective:  Physical Exam  Neuro vascular status intact keratotic lesion mid left foot that upon debridement shows pinpoint bleeding measures about 7 x 7 mm painful to lateral pressure     Assessment:  Probability for some form of benign neoplasm left cannot rule out porokeratotic lesion     Plan:  Full debridement accomplished applied chemical agent to create immune response with sterile dressing explained what to do if blistering were to occur and prescribed Efudex  home treatment.  Reappoint to recheck as needed

## 2025-02-23 ENCOUNTER — Ambulatory Visit (HOSPITAL_BASED_OUTPATIENT_CLINIC_OR_DEPARTMENT_OTHER)
